# Patient Record
Sex: Female | Born: 1975 | Race: White | Hispanic: No | Marital: Single | Smoking: Current every day smoker
Health system: Southern US, Community
[De-identification: ages and names within clinical notes are randomized; demographics above are authoritative.]

## PROBLEM LIST (undated history)

## (undated) DIAGNOSIS — F419 Anxiety disorder, unspecified: Secondary | ICD-10-CM

---

## 2009-02-25 ENCOUNTER — Emergency Department: Payer: Self-pay | Admitting: Emergency Medicine

## 2011-06-12 IMAGING — CT CT STONE STUDY
1 of 2 series · 15 of 32 positions shown, 19 images · non-contrast
Comparison: none

REASON FOR EXAM: pain  bilat. flank
COMMENTS:

[Series 2: stone · axial · 0.56mm/px · z∈[-769,-448]mm · 15 of 121 slices shown, 19 images]
[im 9/121  soft-tissue]
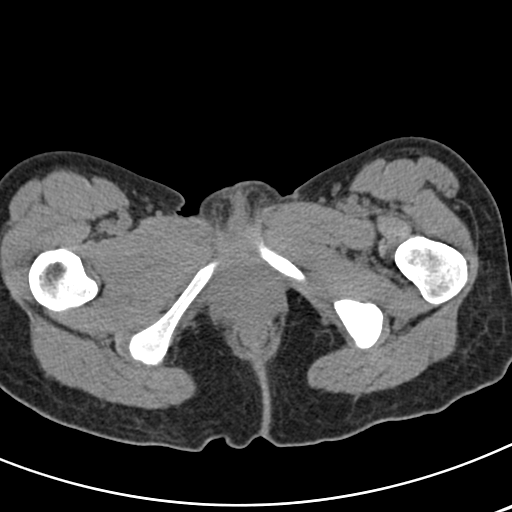
[im 9/121  bone]
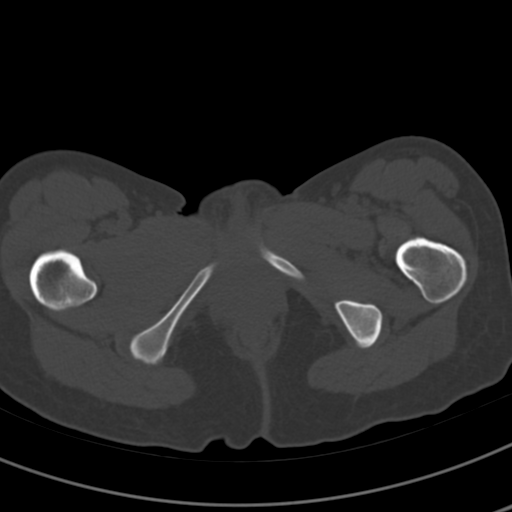
[im 18/121  soft-tissue]
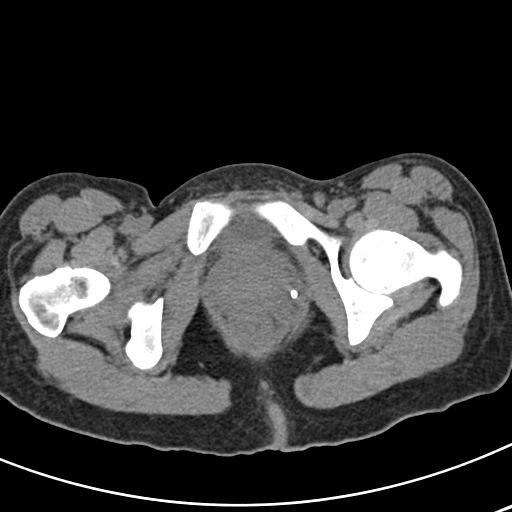
[im 27/121  soft-tissue]
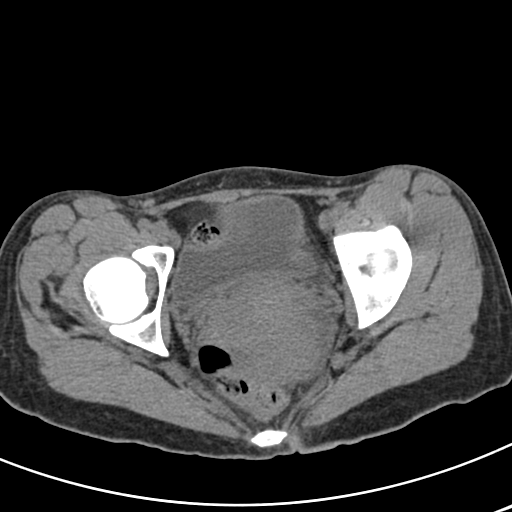
[im 36/121  soft-tissue]
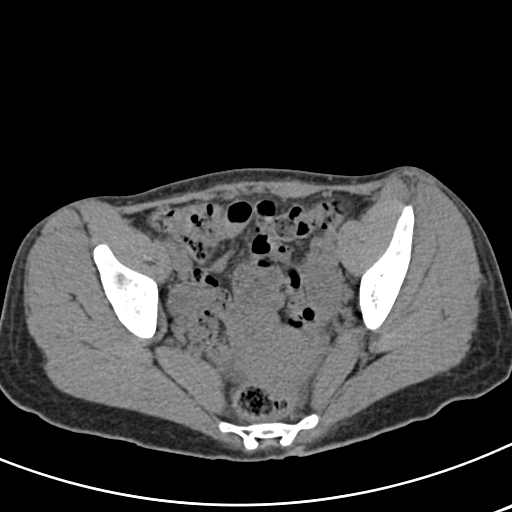
[im 45/121  soft-tissue]
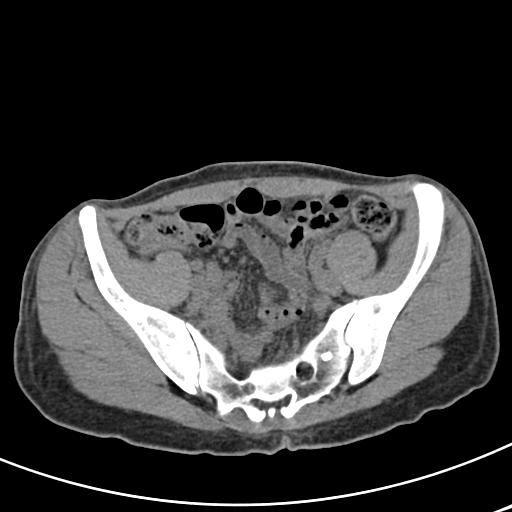
[im 54/121  soft-tissue]
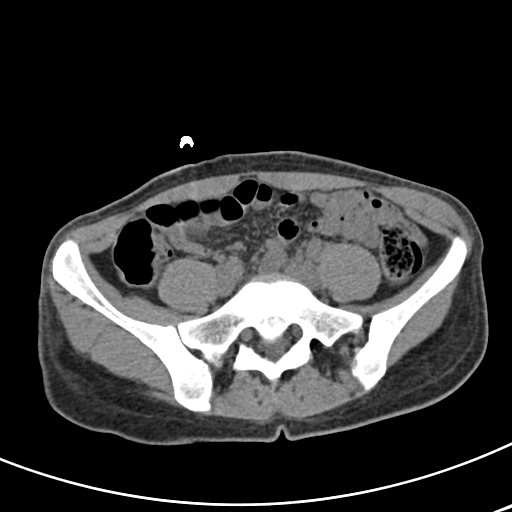
[im 63/121  soft-tissue]
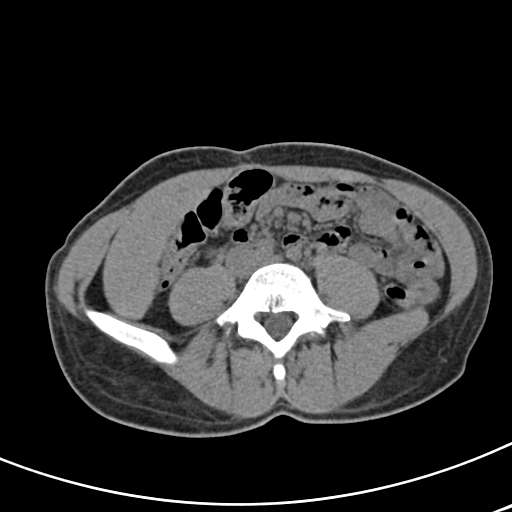
[im 72/121  soft-tissue]
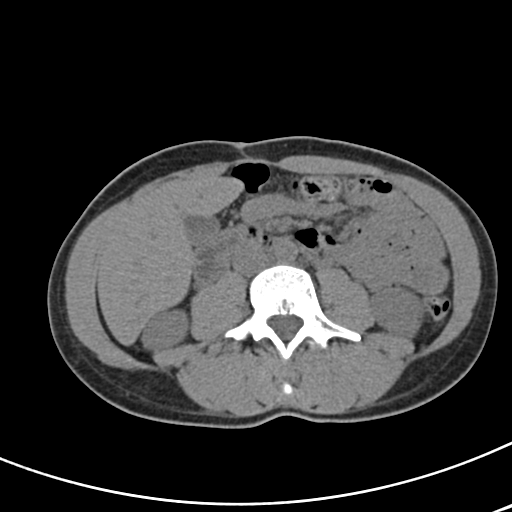
[im 81/121  soft-tissue]
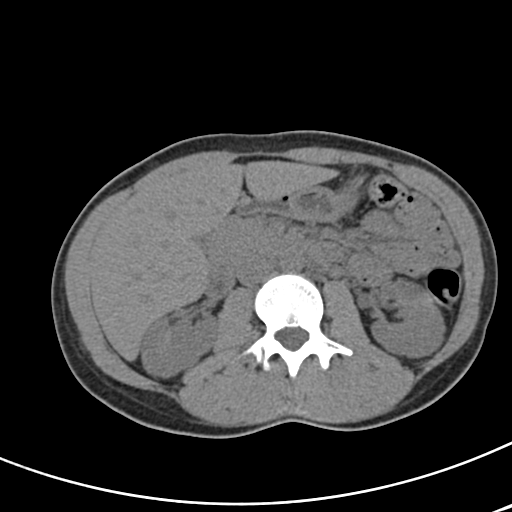
[im 81/121  bone]
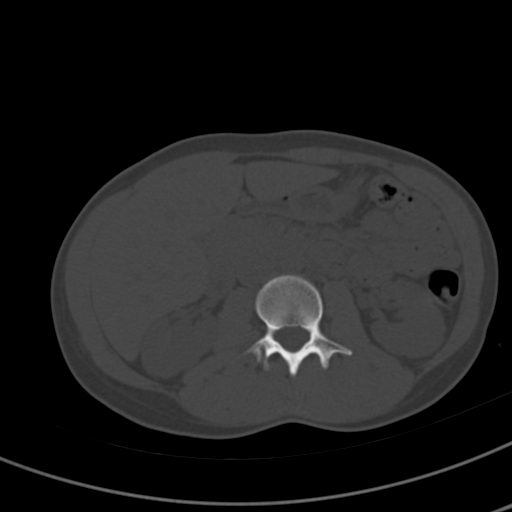
[im 89/121  soft-tissue]
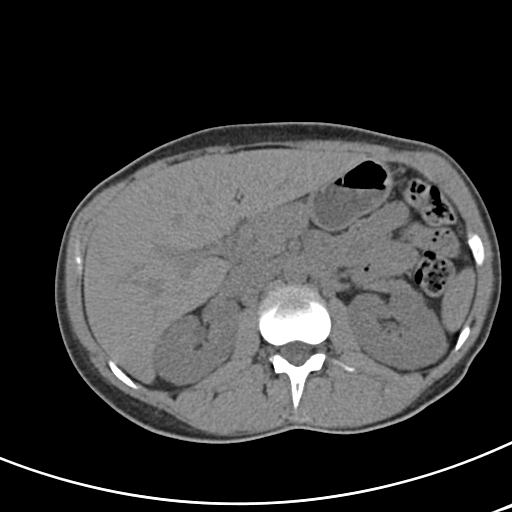
[im 98/121  soft-tissue]
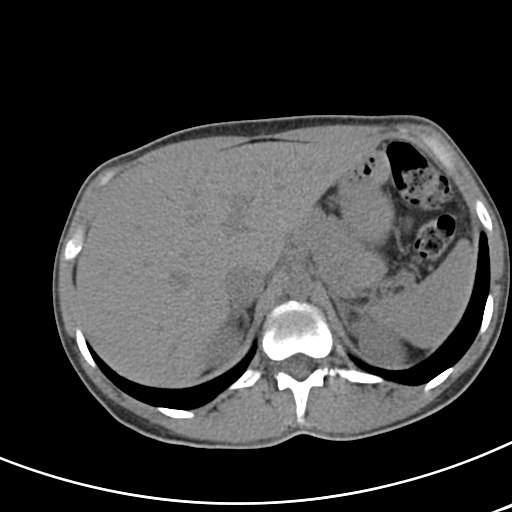
[im 103/121  lung]
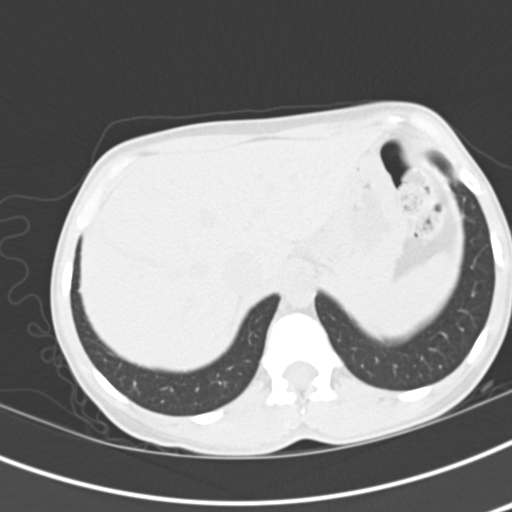
[im 107/121  soft-tissue]
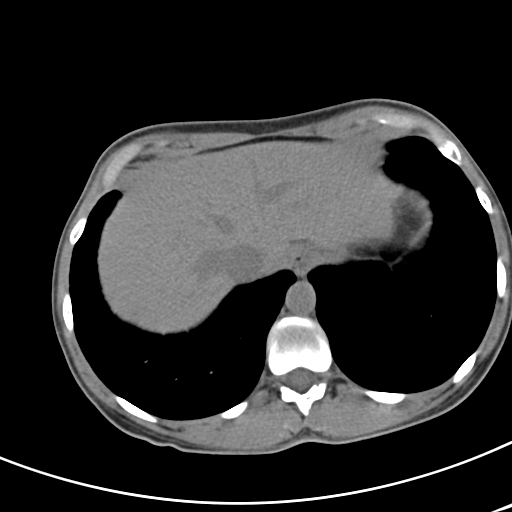
[im 107/121  lung]
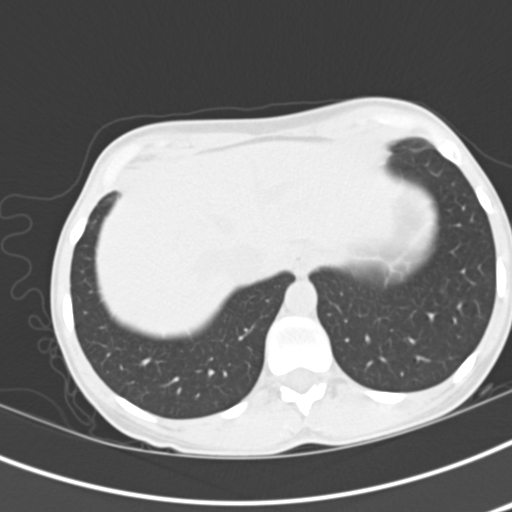
[im 112/121  lung]
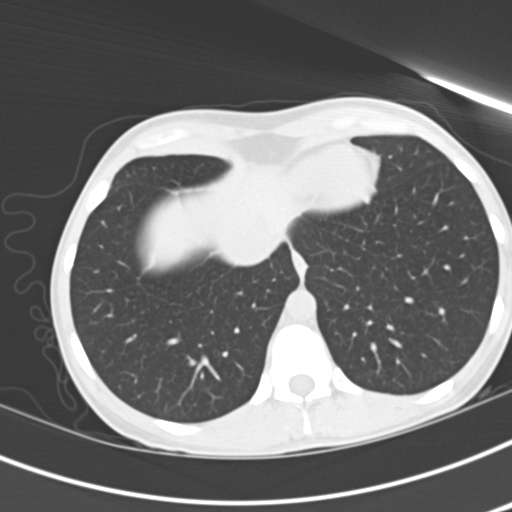
[im 116/121  soft-tissue]
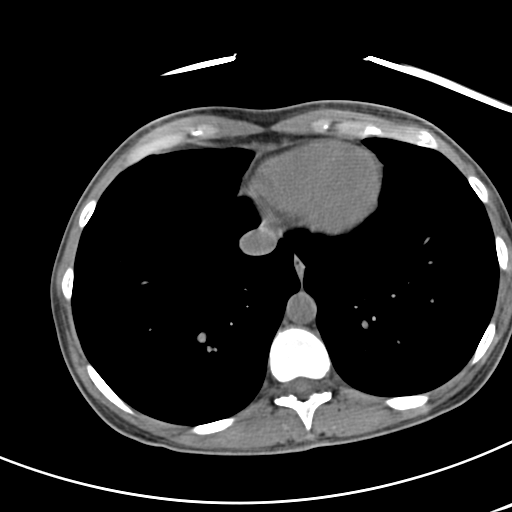
[im 116/121  lung]
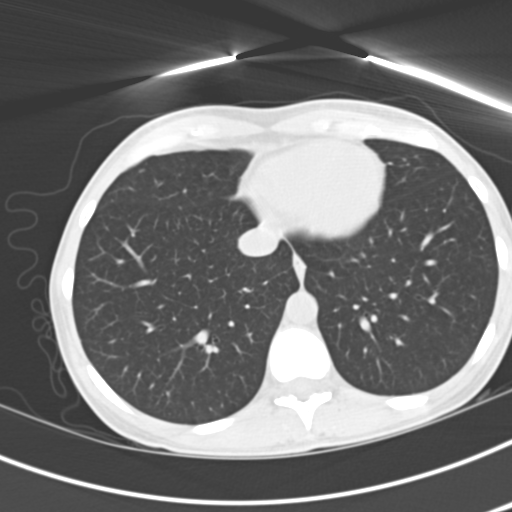

[15 of 32 positions shown; findings below may reference images not displayed]

PROCEDURE:     CT  - CT ABDOMEN /PELVIS WO (STONE)  - February 25, 2009  [DATE]

RESULT:     Axial noncontrast CT scanning was performed through the abdomen
and pelvis at 3 mm intervals and slice thicknesses. Review of 3-dimensional
reconstructed images was performed separately on the [employer] monitor.

The liver, gallbladder, spleen, partially distended stomach, pancreas, and
adrenal glands are normal in appearance. There is a punctate nonobstructing
mid pole stone in the right kidney. On the left there is an approximately 2
mm diameter upper pole nonobstructing stone. There are 3 approximately 1 mm
diameter stones in the lower pole of the left kidney. The perinephric fat
exhibits normal density. The periaortic and pericaval regions are normal.
The unopacified loops of small and large bowel exhibit a normal stool and
gas pattern.

Within the pelvis the uterus is relatively poorly defined it is retroverted.
It measures approximately 7 by 5.1 x 6.5 cm. There is a tiny amount of free
fluid in the pelvis. It is difficult to separate the ovaries and fallopian
tubes from the adjacent unopacified bowel loops. There are structures in the
adnexal regions which likely reflect normal ovaries measuring up to 3 cm in
diameter. The lung bases are clear. The lumbar vertebral bodies are
preserved in height. There is a 6 x 5 mm diameter sclerotic focus consistent
with a bone island in the left aspect of the sacrum.
IMPRESSION: 1. The pelvic structures are difficult to separate from one another. No
definite acute abnormality is seen though there is a small amount of free
fluid in the pelvis.
2. The unopacified loops of small and large bowel are normal in appearance.
3. There are nonobstructing kidney stones bilaterally. These measure 2
millimeters in diameter or less.
4. I do not see evidence of acute hepatobiliary abnormality.

Given that the patient's symptoms are referable to the pelvis, pelvic
ultrasound may be a useful next diagnostic step.

## 2022-10-17 ENCOUNTER — Ambulatory Visit
Admission: EM | Admit: 2022-10-17 | Discharge: 2022-10-17 | Disposition: A | Discharge status: Home / Self Care | Payer: Medicaid Other

## 2022-10-17 ENCOUNTER — Ambulatory Visit (INDEPENDENT_AMBULATORY_CARE_PROVIDER_SITE_OTHER): Payer: Medicaid Other

## 2022-10-17 DIAGNOSIS — M79644 Pain in right finger(s): Secondary | ICD-10-CM

## 2022-10-17 HISTORY — DX: Anxiety disorder, unspecified: F41.9

## 2022-10-17 NOTE — Discharge Instructions (Addendum)
Your x-rays did not show any new fractures.  I suspect that the pain you are experiencing is a result of prolonged immobilization and contraction of your ligaments and tendons.  Leave the splint off and follow the home physical therapy exercises to try and return range of motion to your finger.  I also recommend that you follow-up with orthopedics back home as you may need some dedicated physical therapy to help regain mobility in your finger.  You can use over-the-counter ibuprofen or Aleve according to the package instructions as needed for pain and inflammation.

## 2022-10-17 NOTE — ED Provider Notes (Signed)
[facility]    CSN: [id number] Arrival date & time: 10/17/22  1423      History   Chief Complaint Chief Complaint  Patient presents with   Finger Injury    HPI Bailey Gonzalez is a 47 y.o. female.   HPI  33 female with past medical history of anxiety presents for evaluation of pain and swelling to her right ring finger.  She reports that she broke her right ring finger near the PIP joint, was seen in urgent care, and her finger was placed in a splint.  The same night she was seen she slammed the same finger in a car door but has not been reevaluated.  She has been wearing the splint constantly ever since.  She now complaining that she cannot flex her finger.  She denies numbness or tingling.  Past Medical History:  Diagnosis Date   Anxiety     There are no problems to display for this patient.   History reviewed. No pertinent surgical history.  OB History   No obstetric history on file.      Home Medications    Prior to Admission medications   Medication Sig Start Date End Date Taking? Authorizing Provider  escitalopram (LEXAPRO) 20 MG tablet Take 20 mg by mouth daily.    [provider]    Family History No family history on file.  Social History Social History   Tobacco Use   Smoking status: Every Day    Packs/day: 1    Types: Cigarettes   Smokeless tobacco: Never  Vaping Use   Vaping Use: Never used  Substance Use Topics   Alcohol use: Never     Allergies   Sulfa antibiotics   Review of Systems Review of Systems  Musculoskeletal:  Positive for arthralgias and joint swelling.  Skin:  Negative for color change.  Neurological:  Negative for weakness and numbness.     Physical Exam Triage Vital Signs ED Triage Vitals  Enc Vitals Group     BP      Pulse      Resp      Temp      Temp src      SpO2      Weight      Height      Head Circumference      Peak Flow      Pain Score      Pain Loc      Pain Edu?       Excl. in GC?    No data found.  Updated Vital Signs BP 115/78 (BP Location: Right Arm)   Pulse 79   Temp 98.6 F (37 C) (Oral)   Resp 18   LMP 10/01/2022 (Approximate)   SpO2 98%   Visual Acuity Right Eye Distance:   Left Eye Distance:   Bilateral Distance:    Right Eye Near:   Left Eye Near:    Bilateral Near:     Physical Exam Vitals and nursing note reviewed.  Constitutional:      Appearance: Normal appearance. She is not ill-appearing.  Musculoskeletal:        General: Swelling, tenderness and signs of injury present. No deformity.  Skin:    General: Skin is warm and dry.     Capillary Refill: Capillary refill takes less than 2 seconds.     Findings: No bruising or erythema.  Neurological:     General: No focal deficit present.  Mental Status: She is alert and oriented to person, place, and time.      UC Treatments / Results  Labs (all labs ordered are listed, but only abnormal results are displayed) Labs Reviewed - No data to display  EKG   Radiology DG Finger Ring Right  Result Date: 10/17/2022 CLINICAL DATA:  Pain and swelling. Prior fourth finger fracture 3 weeks ago. Subsequent injury by slamming finger in car door. The patient reports she is now unable to bend finger in the pain is worse. EXAM: RIGHT RING FINGER 2+V COMPARISON:  None Available. FINDINGS: Mild fourth finger soft tissue swelling. No acute fracture line is seen. No healing fracture is identified. No radiopaque foreign body. Joint spaces are preserved. IMPRESSION: Mild fourth finger soft tissue swelling. No acute fracture is seen. Electronically Signed   By: Neita Garnet M.D.   On: 10/17/2022 15:32    Procedures Procedures (including critical care time)  Medications Ordered in UC Medications - No data to display  Initial Impression / Assessment and Plan / UC Course  I have reviewed the triage vital signs and the nursing notes.  Pertinent labs & imaging results that were available  during my care of the patient were reviewed by me and considered in my medical decision making (see chart for details).   The patient is a pleasant, nontoxic-appearing 47 year old female presenting for evaluation of pain and swelling to her right ring finger as outlined in HPI above.  On exam patient's right ring finger is in normal anatomical alignment but the middle phalanx is edematous and tender to palpation.  Patient also has tenderness over the distal phalanx, proximal phalanx, and at the PIP joint.  Also mild tenderness of the MCP joint.  There is no appreciable ecchymosis or erythema.  Patient states she has normal sensation in her fingertip.  I will obtain a radiograph of her right ring finger to look for bony abnormality.  Right ring finger x-rays independently reviewed and evaluated by me.  Impression: Patient has a healing fracture to the distal aspect of the proximal phalanx of her right fourth finger.  No other bony abnormalities noted.  Joint spaces preserved.  Radiology overread is pending.  Radiology impression states mild fourth finger soft tissue swelling but no acute fractures are seen.  I suspect that the patient is experiencing pain and swelling secondary to prolonged immobilization.  I am going to encourage her to leave the splint off and try and put her finger through range of motion exercises.  I will have the patient follow-up with orthopedics near her hometown.   Final Clinical Impressions(s) / UC Diagnoses   Final diagnoses:  Pain of finger of right hand     Discharge Instructions      Your x-rays did not show any new fractures.  I suspect that the pain you are experiencing is a result of prolonged immobilization and contraction of your ligaments and tendons.  Leave the splint off and follow the home physical therapy exercises to try and return range of motion to your finger.  I also recommend that you follow-up with orthopedics back home as you may need some  dedicated physical therapy to help regain mobility in your finger.  You can use over-the-counter ibuprofen or Aleve according to the package instructions as needed for pain and inflammation.     ED Prescriptions   None    PDMP not reviewed this encounter.   Becky Augusta, NP 10/17/22 [phone]

## 2022-10-17 NOTE — ED Triage Notes (Signed)
Patient states she broke her right ring finger 3 weeks ago. States now she is unable to bend it and the pain is worse. Patient removed finger splint in triage.
